# Patient Record
Sex: Female | Born: 2000 | Race: Black or African American | Hispanic: No | Marital: Single | Smoking: Never smoker
Health system: Southern US, Community
[De-identification: ages and names within clinical notes are randomized; demographics above are authoritative.]

## PROBLEM LIST (undated history)

## (undated) DIAGNOSIS — F909 Attention-deficit hyperactivity disorder, unspecified type: Secondary | ICD-10-CM

## (undated) HISTORY — DX: Attention-deficit hyperactivity disorder, unspecified type: F90.9

---
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
## 2022-10-23 ENCOUNTER — Other Ambulatory Visit: Payer: Self-pay

## 2022-10-23 ENCOUNTER — Emergency Department
Admission: EM | Admit: 2022-10-23 | Discharge: 2022-10-23 | Disposition: A | Discharge status: Other Acute Inpatient Hospital | Payer: Medicaid Other | Attending: Emergency Medicine | Admitting: Emergency Medicine

## 2022-10-23 DIAGNOSIS — F411 Generalized anxiety disorder: Secondary | ICD-10-CM | POA: Insufficient documentation

## 2022-10-23 DIAGNOSIS — R45851 Suicidal ideations: Secondary | ICD-10-CM | POA: Insufficient documentation

## 2022-10-23 DIAGNOSIS — R456 Violent behavior: Secondary | ICD-10-CM

## 2022-10-23 DIAGNOSIS — Y907 Blood alcohol level of 200-239 mg/100 ml: Secondary | ICD-10-CM | POA: Insufficient documentation

## 2022-10-23 DIAGNOSIS — F1210 Cannabis abuse, uncomplicated: Secondary | ICD-10-CM | POA: Insufficient documentation

## 2022-10-23 DIAGNOSIS — F4310 Post-traumatic stress disorder, unspecified: Secondary | ICD-10-CM | POA: Diagnosis not present

## 2022-10-23 DIAGNOSIS — Z046 Encounter for general psychiatric examination, requested by authority: Secondary | ICD-10-CM | POA: Diagnosis present

## 2022-10-23 DIAGNOSIS — F332 Major depressive disorder, recurrent severe without psychotic features: Secondary | ICD-10-CM | POA: Diagnosis not present

## 2022-10-23 DIAGNOSIS — F639 Impulse disorder, unspecified: Secondary | ICD-10-CM | POA: Diagnosis not present

## 2022-10-23 DIAGNOSIS — F10920 Alcohol use, unspecified with intoxication, uncomplicated: Secondary | ICD-10-CM | POA: Diagnosis not present

## 2022-10-23 DIAGNOSIS — R4587 Impulsiveness: Secondary | ICD-10-CM

## 2022-10-23 LAB — COMPREHENSIVE METABOLIC PANEL
ALT: 12 U/L (ref 0–44)
AST: 26 U/L (ref 15–41)
Albumin: 4.4 g/dL (ref 3.5–5.0)
Alkaline Phosphatase: 42 U/L (ref 38–126)
Anion gap: 7 (ref 5–15)
BUN: 14 mg/dL (ref 6–20)
CO2: 21 mmol/L — ABNORMAL LOW (ref 22–32)
Calcium: 8.7 mg/dL — ABNORMAL LOW (ref 8.9–10.3)
Chloride: 114 mmol/L — ABNORMAL HIGH (ref 98–111)
Creatinine, Ser: 0.83 mg/dL (ref 0.44–1.00)
GFR, Estimated: >60 mL/min (ref >60)
Glucose, Bld: 85 mg/dL (ref 70–99)
Potassium: 3.7 mmol/L (ref 3.5–5.1)
Sodium: 142 mmol/L (ref 135–145)
Total Bilirubin: 0.9 mg/dL (ref 0.3–1.2)
Total Protein: 7.3 g/dL (ref 6.5–8.1)

## 2022-10-23 LAB — CBC
HCT: 39.1 % (ref 36.0–46.0)
Hemoglobin: 13.2 g/dL (ref 12.0–15.0)
MCH: 31.3 pg (ref 26.0–34.0)
MCHC: 33.8 g/dL (ref 30.0–36.0)
MCV: 92.7 fL (ref 80.0–100.0)
Platelets: 279 10*3/uL (ref 150–400)
RBC: 4.22 MIL/uL (ref 3.87–5.11)
RDW: 13.3 % (ref 11.5–15.5)
WBC: 9.8 10*3/uL (ref 4.0–10.5)
nRBC: 0 % (ref 0.0–0.2)

## 2022-10-23 LAB — URINE DRUG SCREEN, QUALITATIVE (ARMC ONLY)
Amphetamines, Ur Screen: NOT DETECTED
Barbiturates, Ur Screen: NOT DETECTED
Benzodiazepine, Ur Scrn: NOT DETECTED
Cannabinoid 50 Ng, Ur ~~LOC~~: POSITIVE — AB
Cocaine Metabolite,Ur ~~LOC~~: NOT DETECTED
MDMA (Ecstasy)Ur Screen: NOT DETECTED
Methadone Scn, Ur: NOT DETECTED
Opiate, Ur Screen: NOT DETECTED
Phencyclidine (PCP) Ur S: NOT DETECTED
Tricyclic, Ur Screen: NOT DETECTED

## 2022-10-23 LAB — POC URINE PREG, ED: Preg Test, Ur: NEGATIVE

## 2022-10-23 LAB — ETHANOL: Alcohol, Ethyl (B): 205 mg/dL — ABNORMAL HIGH (ref <10)

## 2022-10-23 LAB — SALICYLATE LEVEL: Salicylate Lvl: <7 mg/dL — ABNORMAL LOW (ref 7.0–30.0)

## 2022-10-23 LAB — ACETAMINOPHEN LEVEL: Acetaminophen (Tylenol), Serum: <10 ug/mL — ABNORMAL LOW (ref 10–30)

## 2022-10-23 MED ORDER — OLANZAPINE 10 MG PO TABS: 10.0000 mg | ORAL_TABLET | Freq: Every day | ORAL | Status: DC | PRN

## 2022-10-23 MED ORDER — OLANZAPINE 10 MG IM SOLR: 10.0000 mg | Freq: Every day | INTRAMUSCULAR | Status: DC | PRN

## 2022-10-23 MED ORDER — LORAZEPAM 2 MG/ML IJ SOLN
1.0000 mg | Freq: Once | INTRAMUSCULAR | Status: AC
  Administered 2022-10-23: 1 mg via INTRAMUSCULAR
  Filled 2022-10-23: qty 1

## 2022-10-23 MED ORDER — HALOPERIDOL LACTATE 5 MG/ML IJ SOLN
5.0000 mg | Freq: Once | INTRAMUSCULAR | Status: AC
  Administered 2022-10-23: 5 mg via INTRAMUSCULAR
  Filled 2022-10-23: qty 1
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
## 2022-10-23 NOTE — BH Assessment (Signed)
Per the request of the patient's nurse Marisue Humble), writer spoke patient's mother Alcario Drought). She didn't want her to go to [facility]. Writer explained to her the patient going to the first available bed, to start the treatment process. The mother expressed she didn't like the reviews. Writer attempted to explain more about the process.  Spoke with Psych NP and she contacted psychiatrist and he said for the patient to go to [facility]. ER MD, is aware and agrees with the plan. Patient unable to come to the [facility] during day shift due to staff to patient ratio.  Writer and Psych NP, called the mother and informed her she was going to [facility]. The phone disconnected and unsure all of what she heard. Called back three more times but was unable to reach her. The third call, a HIPAA complaint message was left, saying we were calling to give an update.  Mother returned phone call. Psych NP and TTS spoke with patient's mother, explained she was going to [facility] and the reason. The mother asked if she could come pick her up and take her to another hospital. It it was shared she couldn't due to safety concerns. Prior to explaining more, someone who was in the car with the mother, asked what "right you have to send our daughter to another hospital without our consent." Writer attempted to explained again IVC and she was over the age 21. Mother expressed she was upset she was gone to another hospital and that the mattress had  "666" on it and "shit was on the ceiling and no one want to see that." Psych team attempted to address her concerns about the room, and she stated, "they just trying brush [place] off." Psych NP and TTS stated they weren't but the phone call was disconnected.  Patient was placed under IVC by her mother.

## 2022-10-23 NOTE — ED Notes (Signed)
Psychiatric team at bedside at this time.  

## 2022-10-23 NOTE — ED Provider Notes (Signed)
Emergency Medicine Observation Re-evaluation Note  Carol Winters is a 21 y.o. female, seen on rounds today.  Pt initially presented to the ED for complaints of Suicidal  Currently, the patient is is no acute distress. Denies any concerns at this time.  Physical Exam  Blood pressure 112/85, pulse 91, temperature 98.4 F (36.9 C), temperature source Oral, resp. rate 16, height 4' 9.5" (1.461 m), weight 43.5 kg, last menstrual period 10/16/2022, SpO2 96 %.  Physical Exam: General: No apparent distress Pulm: Normal WOB Neuro: Moving all extremities Psych: Resting comfortably     ED Course / MDM     I have reviewed the labs performed to date as well as medications administered while in observation.  Recent changes in the last 24 hours include: No acute events overnight.  Accepted to [facility].  Transferred to inpatient psychiatric facility in stable condition  Plan   Current plan: Patient awaiting psychiatric disposition. Patient is under full IVC at this time.    Corena Herter, MD 10/23/22 1702

## 2022-10-23 NOTE — BH Assessment (Signed)
Spoke with Carol Winters and informed them transportation will be able to transfer her today.

## 2022-10-23 NOTE — ED Notes (Signed)
Hospital meal provided, pt tolerated w/o complaints.  Waste discarded appropriately.  

## 2022-10-23 NOTE — BH Assessment (Addendum)
Referral information for Psychiatric Hospitalization faxed to;   [facility] ([phone]- [phone]),   [facility] [phone]),  [facility] [phone] -or- [phone]), Staff reports due to medications administered dayshift staff will call back later this afternoon to see how patient's behaviors are  [facility] [phone])  [facility] [phone])
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
## 2022-10-23 NOTE — ED Notes (Addendum)
Per Robinette Haines, pt can be transported to [facility] as pt is IVCd

## 2022-10-23 NOTE — ED Notes (Signed)
Multiple visitors arriving for pt and calling hospital for pt. Family and friends informed of visitor and phone policy by this RN and first nurse.

## 2022-10-23 NOTE — Consult Note (Signed)
The client was accepted to [facility] for stabilization.  Her mother was visiting and said she did not want her to go there.  TTS tried to stop the transfer but the police were here.  It was decided by the EDP, psych team, and Dr. Toni Amend that the client needed to go to receive prompt care and keep the ED traffic flowing.  Her mother was notified and disconnected on her end after explaining the process with the team and the EDP.  She was called back two times with no answer.  The last time a voicemail was available and a message was left.  She eventually called back and had lost signal [place] the country and the process was explained again.  Her mother was concerned about the bad reviews posted.  She is also not happy with not being able to take her home.  The team politely explained the process and how we wanted her to get prompt care that she needed.  (Shawanna's brother killed himself in front of her which triggered this yesterday.)  The mother was not happy with the room that she was placed in the ED, upset that the numbers of 666 was on the mattress and upset that her daughter could not operate the television without the remote. Therapeutic listening was used and reassured the mother we were listening when she accused the team of being "dismissive"  The call ended abruptly on  their end.    Caveat:  The mother IVC'd her.  Nanine Means, PMHNP

## 2022-10-23 NOTE — ED Notes (Signed)
Pt's mother updated on pt's transfer to [facility]. Pt's mother tearful. Pt's mother visited pt, pt seemed upbeat after visit.

## 2022-10-23 NOTE — ED Notes (Signed)
Pt awake at this time. States she is ready to leave, officer in the [place] at this time explained to pt that she under IVC and would not be able to leave at this time. Pt requesting the phone to call mother. Phone given at this time.   Breakfast tray given.

## 2022-10-23 NOTE — BH Assessment (Signed)
Patient has been accepted to [facility].  Patient assigned to [facility] Accepting physician is Dr. Georgia Duff.  Call report to [phone].  Representative was [employer].   ER Staff is aware of it:  Marisue Humble, ER Antonieta Iba, Patient's Nurse     Address: [address],  [place], [place]   [facility] aware of no transportation and will hold the bed for tomorrow(10/24/2022).
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
## 2022-10-23 NOTE — ED Notes (Signed)
Prior to IM medication administration therapeutic communication attempted by nursing staff and multiple security staff members. Pt continuing to attempt to leave, attempted to wrap blanket around neck, and it was medically necessary for pt to be held by nursing staff and security to be medicated for safety.  Pt dressed into burgundy scrub top, burgundy scrub bottom, and yellow socks with this RN, [employer] EDT, and [employer] present. Pt refuses to remove personal underwear. Pts belongings include: black pants, grey long sleeve shirt, phone, hair tie, black bra, pair of yellow stud earrings.  Belongings placed in single pt belonging bag and labeled appropriately.

## 2022-10-23 NOTE — ED Notes (Signed)
Pt's mother called this RN and stated that she had read the online reviews for [facility] and does not want her daughter to go there. Pt's mother states that she will find placement for pt herself.  Pt's mother encouraged that pt needs inpatient placement. Carol Winters called and given mother's phone number to take with mother about placement.

## 2022-10-23 NOTE — BH Assessment (Signed)
Writer spoke with the patient to complete an updated/reassessment. Patient has poor insight about her depression and SI. She admits to having had thoughts of ending her life, but  unable to share what would prevent her from trying again, except she want to go home.

## 2022-10-23 NOTE — ED Notes (Signed)
Family visiting with pt at this time  

## 2022-10-23 NOTE — ED Notes (Signed)
Received call from [facility] staff member to inquire about pt placement. Informed staff member that pt had been medicated for safety and was sleeping. Advised that representative will call back at more appropriate time to assess possibility of pt placement at [facility].
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
## 2022-10-23 NOTE — ED Notes (Signed)
Pt transported by [facility] department, pt alert and compliant.

## 2022-10-23 NOTE — ED Triage Notes (Signed)
Pt arrives from home via [facility] with CC of suicidal ideation with related superficial abrasions to L forearm and L side of neck.
